# Patient Record
Sex: Female | Born: 1935 | Race: White | Hispanic: No | Smoking: Former smoker
Health system: Southern US, Community
[De-identification: ages and names within clinical notes are randomized; demographics above are authoritative.]

## PROBLEM LIST (undated history)

## (undated) DIAGNOSIS — D649 Anemia, unspecified: Secondary | ICD-10-CM

## (undated) DIAGNOSIS — I82409 Acute embolism and thrombosis of unspecified deep veins of unspecified lower extremity: Secondary | ICD-10-CM

## (undated) DIAGNOSIS — I4891 Unspecified atrial fibrillation: Secondary | ICD-10-CM

## (undated) HISTORY — DX: Acute embolism and thrombosis of unspecified deep veins of unspecified lower extremity: I82.409

## (undated) HISTORY — PX: CHOLECYSTECTOMY: SHX55

## (undated) HISTORY — PX: CATARACT EXTRACTION: SUR2

## (undated) HISTORY — DX: Anemia, unspecified: D64.9

## (undated) HISTORY — DX: Unspecified atrial fibrillation: I48.91

## (undated) HISTORY — PX: JOINT REPLACEMENT: SHX530

## (undated) HISTORY — PX: ABDOMINAL AORTIC ANEURYSM REPAIR: SUR1152

## (undated) HISTORY — PX: FOOT SURGERY: SHX648

---

## 2014-08-13 ENCOUNTER — Encounter: Payer: Self-pay | Admitting: Podiatry

## 2014-08-13 ENCOUNTER — Ambulatory Visit (INDEPENDENT_AMBULATORY_CARE_PROVIDER_SITE_OTHER): Payer: MEDICARE | Admitting: Podiatry

## 2014-08-13 VITALS — BP 134/62 | HR 72

## 2014-08-13 DIAGNOSIS — M2040 Other hammer toe(s) (acquired), unspecified foot: Secondary | ICD-10-CM | POA: Insufficient documentation

## 2014-08-13 DIAGNOSIS — L600 Ingrowing nail: Secondary | ICD-10-CM | POA: Diagnosis not present

## 2014-08-13 DIAGNOSIS — M79606 Pain in leg, unspecified: Secondary | ICD-10-CM

## 2014-08-13 DIAGNOSIS — M79605 Pain in left leg: Secondary | ICD-10-CM

## 2014-08-13 DIAGNOSIS — B351 Tinea unguium: Secondary | ICD-10-CM

## 2014-08-13 NOTE — Progress Notes (Signed)
Subjective: 81 year old female presents complaining of pain in big toes and corns, and requesting toe nails trimmed. She has moved recently from [place], [place], where she used to have regular foot care done every 3 months.   Review of Systems - General ROS: negative for - chills, fever, hot flashes or sleep disturbance Ophthalmic ROS: Has had cataract replaced. Allergy and Immunology ROS: Penicillin, Floracabins, Procaine, Soybean, Beans, IV Dye, Aspirin.  Breast ROS: negative for breast lumps Respiratory ROS: no cough, shortness of breath, or wheezing Cardiovascular ROS: no chest pain or dyspnea on exertion Gastrointestinal ROS: Has Hiatal hernia. Genito-Urinary ROS: no dysuria, trouble voiding, or hematuria Musculoskeletal ROS: Hip right and shoulder pain. Right knee pain and need implant.  Neurological ROS: no TIA or stroke symptoms Dermatological ROS: negative.  Objective: Dermatologic:  Mild digital corn 3rd right, 2nd and 5th left. Ingrown hallucal nail with pain on both great toes. No drainage or signs of infection noted. Thick deformed nails x 10. Vascular: Pedal pulses are not palpable bilateral, both DP and PT. Positive of ankle edema, pitting bilateral. Neurologic: All epicritic and tactile sensations grossly intact. Normal response to Monofilament sensory testing and Vibratory sensor testing. Ankle DTR has normal response. Orthopedic: Contracted digit with digital corn 2nd and 5th left, 3rd right. Mildly enlarged medial eminence of first metatarsal bone bilateral.  Assessment: Painful ingrown nail both great toes without infection. Mycotic nails x 10. Vascular insufficiency bilateral. Painful hammer toe with digital corns 3rd right, 2nd and 5th left.  Plan: Reviewed findings and available treatment options. All nails debrided. Ingrown nail offending borders resected. All painful corns debrided. Left great toe, bleeding lateral border cleansed with Betadine and dry sterile  compression bandage applied. Return in 3 months.

## 2014-08-13 NOTE — Patient Instructions (Signed)
Seen for hypertrophic and painful nails. All nails debrided. Ingrown nail resected. Return in 3 months or as needed.

## 2014-08-15 ENCOUNTER — Telehealth (HOSPITAL_COMMUNITY): Payer: Self-pay | Admitting: *Deleted

## 2014-08-15 NOTE — Telephone Encounter (Signed)
I returned a call to Becky Wells in regards to a voice message I received from him.  His mother was concerned about her dye allergy and wanted assurance that our procedure does not involve the use of dye.  I assured him that this was an ultrasound appointment and no dye would be utilized during her procedure.

## 2014-08-16 ENCOUNTER — Encounter: Payer: Self-pay | Admitting: Surgery

## 2014-08-19 ENCOUNTER — Ambulatory Visit (INDEPENDENT_AMBULATORY_CARE_PROVIDER_SITE_OTHER): Payer: MEDICARE | Admitting: Surgery

## 2014-08-19 ENCOUNTER — Other Ambulatory Visit (HOSPITAL_COMMUNITY): Payer: Self-pay

## 2014-08-19 ENCOUNTER — Encounter: Payer: Self-pay | Admitting: Surgery

## 2014-08-19 VITALS — BP 145/54 | HR 64 | Ht 63.0 in | Wt 150.0 lb

## 2014-08-19 DIAGNOSIS — Z01810 Encounter for preprocedural cardiovascular examination: Secondary | ICD-10-CM | POA: Diagnosis not present

## 2014-08-19 DIAGNOSIS — I714 Abdominal aortic aneurysm, without rupture, unspecified: Secondary | ICD-10-CM

## 2014-08-19 MED ORDER — PREDNISONE 50 MG PO TABS: ORAL_TABLET | ORAL | Status: DC

## 2014-08-19 MED ORDER — DIPHENHYDRAMINE HCL 50 MG PO CAPS: ORAL_CAPSULE | ORAL | Status: DC

## 2014-08-19 NOTE — Progress Notes (Signed)
Patient name: Becky Wells MRN: [id number] DOB: 1919-05-30 Sex: female   Referred by: Dr. Riley NearingAguiar  Reason for referral:  Chief Complaint  Patient presents with  . New Evaluation    eval EVAR repair - needs to be est locally    HISTORY OF PRESENT ILLNESS: This is a 81 year old female who comes in today to establish vascular care.  In October 2015, she underwent endovascular repair of a 7 cm abdominal aortic aneurysm, while in [place].  She states that she has not had any postoperative imaging.  She complains of swelling around her right groin incision site which has been getting bigger.  She denies any fevers or chills.  The patient also complains of lower extremity swelling.  She cannot wear compression stockings, as she cannot get them on.  She is medically managed for hypercholesterolemia with a statin.  She is treated with multiple medications for hypertension.  She carries a diagnosis of atrial fibrillation and history of DVT but is not on anticoagulation.  She has a history of smoking but quit many years ago.  She now lives in a retirement community  Past Medical History  Diagnosis Date  . Anemia   . Atrial fibrillation   . DVT (deep venous thrombosis)     Past Surgical History  Procedure Laterality Date  . Joint replacement Left     knee  . Cataract extraction Bilateral   . Abdominal aortic aneurysm repair    . Cholecystectomy    . Foot surgery      toe    History   Social History  . Marital Status: Widowed    Spouse Name: N/A  . Number of Children: N/A  . Years of Education: N/A   Occupational History  . Not on file.   Social History Main Topics  . Smoking status: Former Smoker    Types: Cigarettes    Quit date: 07/06/1955  . Smokeless tobacco: Never Used  . Alcohol Use: No  . Drug Use: No  . Sexual Activity: Not on file   Other Topics Concern  . Not on file   Social History Narrative    Family History  Problem Relation Age of Onset  . Heart disease  Mother   . Hypertension Mother   . Varicose Veins Mother   . Heart attack Mother   . Hypertension Father   . Heart disease Father   . Heart attack Father   . Diabetes Sister   . Hypertension Sister   . Heart attack Sister   . Hypertension Brother   . Heart attack Brother   . Deep vein thrombosis Son   . Hypertension Son     Allergies as of 08/19/2014 - Review Complete 08/19/2014  Allergen Reaction Noted  . Aspirin  08/13/2014  . Iodinated diagnostic agents  08/13/2014  . Penicillins  08/13/2014  . Procaine  08/13/2014  . Soybean oil  08/13/2014    Current Outpatient Prescriptions on File Prior to Visit  Medication Sig Dispense Refill  . amLODipine (NORVASC) 5 MG tablet Take 5 mg by mouth.    . bisacodyl (DULCOLAX) 10 MG suppository Place 10 mg rectally.    . calcium-vitamin D (OSCAL WITH D) 500-200 MG-UNIT per tablet Take by mouth.    . ferrous sulfate 325 (65 FE) MG tablet Take 325 mg by mouth.    . folic acid (FOLVITE) 1 MG tablet Take 1 mg by mouth.    . guaifenesin (ROBITUSSIN) 100 MG/5ML syrup Take  200 mg by mouth.    Marland Kitchen HYDROcodone-acetaminophen (NORCO/VICODIN) 5-325 MG per tablet Take by mouth.    . losartan (COZAAR) 100 MG tablet Take 100 mg by mouth.    . metoprolol tartrate (LOPRESSOR) 25 MG tablet Take 25 mg by mouth.    . Naproxen Sodium 220 MG CAPS Take by mouth as needed.     . pantoprazole (PROTONIX) 20 MG tablet Take 20 mg by mouth.    . potassium chloride SA (K-DUR,KLOR-CON) 20 MEQ tablet Take by mouth.    . senna (SENOKOT) 8.6 MG tablet Take by mouth as needed.     . sertraline (ZOLOFT) 25 MG tablet Take 25 mg by mouth.    . simvastatin (ZOCOR) 20 MG tablet Take 20 mg by mouth.    . spironolactone (ALDACTONE) 25 MG tablet Take 25 mg by mouth.     No current facility-administered medications on file prior to visit.     REVIEW OF SYSTEMS: Cardiovascular: No chest pain, chest pressure, palpitations, orthopnea, or dyspnea on exertion. No claudication or  rest pain,  positive for leg swelling and varicose veins Pulmonary: No productive cough, asthma or wheezing. Neurologic: No weakness, paresthesias, aphasia, or amaurosis. No dizziness. Hematologic: No bleeding problems or clotting disorders. Musculoskeletal: No joint pain or joint swelling. Gastrointestinal: No blood in stool or hematemesis Genitourinary: No dysuria or hematuria. Psychiatric:: No history of major depression. Integumentary: No rashes or ulcers. Constitutional: No fever or chills.  PHYSICAL EXAMINATION: General: The patient appears their stated age.  Vital signs are BP 145/54 mmHg  Pulse 64  Ht  (1.6 m)  Wt 150 lb (68.04 kg)  BMI 26.58 kg/m2  SpO2 99% HEENT:  No gross abnormalities Pulmonary: Respirations are non-labored Abdomen: Soft and non-tender .  I cannot palpate a pulsatile mass Musculoskeletal: There are no major deformities.   Neurologic: No focal weakness or paresthesias are detected, Skin: There are no ulcer or rashes noted. Psychiatric: The patient has normal affect. Cardiovascular: There is a regular rate and rhythm without significant murmur appreciated.  No carotid bruits.  Bilateral edema  Prominent bulging in the right groin consistent with seroma, as there is no pulsatility.  There is no evidence of infection  Diagnostic Studies: None    Assessment:  History of abdominal aortic aneurysm Plan: Patient is transferring her vascular care to me, as she has recently moved to the area.  She tells me that she has not had any postoperative imaging.  She does have a history of renal insufficiency.  Her most recent creatinine was 1.6, however recently it has been as high as 1.9.  I would like to get a contrasted CT scan so I can evaluate her repair, as well as evaluate the fluid collection in her right groin which I expect to be a seroma but want to make sure there is no pseudoaneurysm present.  The patient also has a contrast allergy.  She has  tolerated premedication in the past and I will do so again this time.  On the patient returns I will get a carotid ultrasound that she tells me she has not had her carotid arteries ever evaluated.     Jorge Ny, M.D. [facility] Office: [phone] Pager:  [phone]

## 2014-08-19 NOTE — Addendum Note (Signed)
Addended by: Sharee PimpleMCCHESNEY, MARILYN K on: 08/19/2014 10:05 AM   Modules accepted: Orders

## 2014-08-27 ENCOUNTER — Other Ambulatory Visit: Payer: Self-pay | Admitting: *Deleted

## 2014-08-27 DIAGNOSIS — Z01810 Encounter for preprocedural cardiovascular examination: Secondary | ICD-10-CM

## 2014-08-27 DIAGNOSIS — I714 Abdominal aortic aneurysm, without rupture, unspecified: Secondary | ICD-10-CM

## 2014-08-27 MED ORDER — DIPHENHYDRAMINE HCL 50 MG PO CAPS: ORAL_CAPSULE | ORAL | Status: DC

## 2014-08-27 MED ORDER — PREDNISONE 50 MG PO TABS: ORAL_TABLET | ORAL | Status: DC

## 2014-08-28 ENCOUNTER — Ambulatory Visit (HOSPITAL_COMMUNITY)
Admission: RE | Admit: 2014-08-28 | Discharge: 2014-08-28 | Disposition: A | Discharge status: Home / Self Care | Payer: Medicare Other | Source: Ambulatory Visit | Attending: Surgery | Admitting: Surgery

## 2014-08-28 ENCOUNTER — Encounter (HOSPITAL_COMMUNITY): Payer: Self-pay

## 2014-08-28 DIAGNOSIS — I714 Abdominal aortic aneurysm, without rupture, unspecified: Secondary | ICD-10-CM

## 2014-08-28 DIAGNOSIS — Z01810 Encounter for preprocedural cardiovascular examination: Secondary | ICD-10-CM | POA: Diagnosis not present

## 2014-08-28 LAB — POCT I-STAT CREATININE: CREATININE: 1.3 mg/dL — AB (ref 0.50–1.10)

## 2014-08-28 MED ORDER — IOHEXOL 350 MG/ML SOLN
80.0000 mL | Freq: Once | INTRAVENOUS | Status: AC | PRN
  Administered 2014-08-28: 80 mL via INTRAVENOUS

## 2014-08-28 NOTE — Progress Notes (Signed)
Premedicated for iodianated allergy

## 2014-09-02 ENCOUNTER — Ambulatory Visit: Payer: Self-pay | Admitting: Surgery

## 2014-09-02 ENCOUNTER — Other Ambulatory Visit (HOSPITAL_COMMUNITY): Payer: Self-pay

## 2014-09-06 ENCOUNTER — Encounter: Payer: Self-pay | Admitting: Surgery

## 2014-09-09 ENCOUNTER — Other Ambulatory Visit: Payer: Self-pay | Admitting: Surgery

## 2014-09-09 ENCOUNTER — Ambulatory Visit (HOSPITAL_COMMUNITY)
Admission: RE | Admit: 2014-09-09 | Discharge: 2014-09-09 | Disposition: A | Discharge status: Home / Self Care | Payer: Medicare Other | Source: Ambulatory Visit | Attending: Surgery | Admitting: Surgery

## 2014-09-09 ENCOUNTER — Encounter: Payer: Self-pay | Admitting: Surgery

## 2014-09-09 ENCOUNTER — Other Ambulatory Visit: Payer: Self-pay

## 2014-09-09 ENCOUNTER — Ambulatory Visit (INDEPENDENT_AMBULATORY_CARE_PROVIDER_SITE_OTHER): Payer: MEDICARE | Admitting: Surgery

## 2014-09-09 VITALS — BP 148/73 | HR 73 | Ht 63.0 in | Wt 150.0 lb

## 2014-09-09 DIAGNOSIS — I714 Abdominal aortic aneurysm, without rupture, unspecified: Secondary | ICD-10-CM

## 2014-09-09 DIAGNOSIS — I6523 Occlusion and stenosis of bilateral carotid arteries: Secondary | ICD-10-CM

## 2014-09-09 DIAGNOSIS — Z01810 Encounter for preprocedural cardiovascular examination: Secondary | ICD-10-CM

## 2014-09-09 MED ORDER — PREDNISONE 50 MG PO TABS: ORAL_TABLET | ORAL | Status: DC

## 2014-09-09 MED ORDER — DIPHENHYDRAMINE HCL 50 MG PO CAPS: ORAL_CAPSULE | ORAL | Status: DC

## 2014-09-09 NOTE — Progress Notes (Signed)
Patient name: Becky Wells MRN: [id number] DOB: 1919-02-10 Sex: female     Chief Complaint  Patient presents with  . Re-evaluation    1-3 wk f/u- CTA prior and carotid duplex    HISTORY OF PRESENT ILLNESS: The patient is back today for follow-up.  She is status post endovascular aneurysm repair in October 2015 for 7 cm aneurysm.  This was done in [place].  She has not had postoperative imaging.  She is transferring her care to me.  She also complains of swelling in her right groin.  I sent her for a CT scan.  She is back today to discuss these results.  Past Medical History  Diagnosis Date  . Anemia   . Atrial fibrillation   . DVT (deep venous thrombosis)     Past Surgical History  Procedure Laterality Date  . Joint replacement Left     knee  . Cataract extraction Bilateral   . Abdominal aortic aneurysm repair    . Cholecystectomy    . Foot surgery      toe    History   Social History  . Marital Status: Widowed    Spouse Name: N/A  . Number of Children: N/A  . Years of Education: N/A   Occupational History  . Not on file.   Social History Main Topics  . Smoking status: Former Smoker    Types: Cigarettes    Quit date: 07/06/1955  . Smokeless tobacco: Never Used  . Alcohol Use: No  . Drug Use: No  . Sexual Activity: Not on file   Other Topics Concern  . Not on file   Social History Narrative    Family History  Problem Relation Age of Onset  . Heart disease Mother   . Hypertension Mother   . Varicose Veins Mother   . Heart attack Mother   . Hypertension Father   . Heart disease Father   . Heart attack Father   . Diabetes Sister   . Hypertension Sister   . Heart attack Sister   . Hypertension Brother   . Heart attack Brother   . Deep vein thrombosis Son   . Hypertension Son     Allergies as of 09/09/2014 - Review Complete 09/09/2014  Allergen Reaction Noted  . Aspirin  08/13/2014  . Iodinated diagnostic agents  08/13/2014  . Penicillins   08/13/2014  . Procaine  08/13/2014  . Soybean oil  08/13/2014    Current Outpatient Prescriptions on File Prior to Visit  Medication Sig Dispense Refill  . amLODipine (NORVASC) 5 MG tablet Take 5 mg by mouth.    . bisacodyl (DULCOLAX) 10 MG suppository Place 10 mg rectally.    . calcium-vitamin D (OSCAL WITH D) 500-200 MG-UNIT per tablet Take by mouth.    . Cyanocobalamin (VITAMIN B12 PO) Take by mouth daily.    . diphenhydrAMINE (BENADRYL) 50 MG capsule Take 50 mg by mouth 1 hour prior to your procedure. 2 capsule 0  . ferrous sulfate 325 (65 FE) MG tablet Take 325 mg by mouth.    . folic acid (FOLVITE) 1 MG tablet Take 1 mg by mouth.    . guaifenesin (ROBITUSSIN) 100 MG/5ML syrup Take 200 mg by mouth.    Marland Kitchen HYDROcodone-acetaminophen (NORCO/VICODIN) 5-325 MG per tablet Take by mouth.    . losartan (COZAAR) 100 MG tablet Take 100 mg by mouth.    . metoprolol tartrate (LOPRESSOR) 25 MG tablet Take 25 mg by mouth.    Marland Kitchen  Naproxen Sodium 220 MG CAPS Take by mouth as needed.     . pantoprazole (PROTONIX) 20 MG tablet Take 20 mg by mouth.    . potassium chloride SA (K-DUR,KLOR-CON) 20 MEQ tablet Take by mouth.    . predniSONE (DELTASONE) 50 MG tablet One tablet (50mg ) 13 hours prior to procedure; one tablet (50mg ) 7 hours prior to procedure and then one tablet (50 mg) one hour prior to procedure. 3 tablet 0  . senna (SENOKOT) 8.6 MG tablet Take by mouth as needed.     . sertraline (ZOLOFT) 25 MG tablet Take 25 mg by mouth.    . simvastatin (ZOCOR) 20 MG tablet Take 20 mg by mouth.    . spironolactone (ALDACTONE) 25 MG tablet Take 25 mg by mouth.     No current facility-administered medications on file prior to visit.     REVIEW OF SYSTEMS: No changes from prior visit  PHYSICAL EXAMINATION:   Vital signs are BP 148/73 mmHg  Pulse 73  Ht 5\' 3"  (1.6 m)  Wt 150 lb (68.04 kg)  BMI 26.58 kg/m2  SpO2 98% General: The patient appears their stated age. HEENT:  No gross  abnormalities Pulmonary:  Non labored breathing Abdomen: Persistent swelling in the right groin without significant change.  No evidence of infection Musculoskeletal: There are no major deformities. Neurologic: No focal weakness or paresthesias are detected, Skin: There are no ulcer or rashes noted. Psychiatric: The patient has normal affect. Cardiovascular: There is a regular rate and rhythm without significant murmur appreciated.   Diagnostic Studies I have reviewed her CT scan.  Her stent graft is in excellent position.  There is a small type II endoleak.  She does have a significant seroma on the right groin.  Carotid Doppler studies were performed today.  No significant stenosis was identified.  Assessment: Abdominal aortic aneurysm Plan: The patient has a small type II endoleak.  I will reevaluate this in 6 months with a repeat CT scan.  She does have a large seroma in the right groin.  I discussed with her son that given her age and the fact that this is not painful, I would prefer to manage this nonoperatively.  She will continue to observe this.  If it increases in size or spontaneously drained she will contact me and we will consider operative treatment.  Otherwise she will follow up in 6 months with her CT scan.  Becky Wells, M.D. [facility] [place] Office: [phone] Pager:  [phone]

## 2014-11-11 ENCOUNTER — Ambulatory Visit: Payer: Medicare Other | Admitting: Podiatry

## 2014-11-12 ENCOUNTER — Ambulatory Visit (INDEPENDENT_AMBULATORY_CARE_PROVIDER_SITE_OTHER): Payer: MEDICARE | Admitting: Podiatry

## 2014-11-12 ENCOUNTER — Encounter: Payer: Self-pay | Admitting: Podiatry

## 2014-11-12 VITALS — BP 151/99 | HR 67

## 2014-11-12 DIAGNOSIS — M79606 Pain in leg, unspecified: Secondary | ICD-10-CM

## 2014-11-12 DIAGNOSIS — B351 Tinea unguium: Secondary | ICD-10-CM

## 2014-11-12 DIAGNOSIS — L600 Ingrowing nail: Secondary | ICD-10-CM

## 2014-11-12 NOTE — Patient Instructions (Signed)
Seen for hypertrophic nails. All nails debrided. Return in 3 months or as needed.  

## 2014-11-12 NOTE — Progress Notes (Signed)
Subjective: 81 year old female presents complaining of pain in big toes and corns, and requesting toe nails trimmed. Left big toe has been hurting.   Objective: Dermatologic: Digital corn 5th right. Painful ingrown nail left great toe without infection.  Thick deformed nails x 10. Vascular: Pedal pulses are not palpable bilateral, both DP and PT. Positive of ankle edema, pitting bilateral. Neurologic: All epicritic and tactile sensations grossly intact. Normal response to Monofilament sensory testing and Vibratory sensor testing. Ankle DTR has normal response. Orthopedic: Contracted digit with digital corn 2nd and 5th left, 3rd right. Mildly enlarged medial eminence of first metatarsal bone bilateral.  Assessment: Painful ingrown nail left hallux without infection. Mycotic nails x 10. Vascular insufficiency bilateral. Painful hammer toe 5th right.  Bunion deformity bilateral.   Plan: Reviewed findings and available treatment options. All nails debrided. Ingrown nail offending borders resected. All painful corns debrided. Return in 3 months

## 2015-02-12 ENCOUNTER — Ambulatory Visit (INDEPENDENT_AMBULATORY_CARE_PROVIDER_SITE_OTHER): Payer: MEDICARE | Admitting: Podiatry

## 2015-02-12 ENCOUNTER — Encounter: Payer: Self-pay | Admitting: Podiatry

## 2015-02-12 VITALS — BP 172/70 | HR 72 | Ht 63.0 in | Wt 160.0 lb

## 2015-02-12 DIAGNOSIS — M79673 Pain in unspecified foot: Secondary | ICD-10-CM

## 2015-02-12 DIAGNOSIS — L600 Ingrowing nail: Secondary | ICD-10-CM

## 2015-02-12 DIAGNOSIS — B351 Tinea unguium: Secondary | ICD-10-CM

## 2015-02-12 DIAGNOSIS — M79606 Pain in leg, unspecified: Secondary | ICD-10-CM

## 2015-02-12 NOTE — Patient Instructions (Signed)
Seen for hypertrophic nails. All nails debrided. Return in 3 months or as needed.  

## 2015-02-12 NOTE — Progress Notes (Signed)
Subjective: 81 year old female presents complaining of pain in big toes and corns, and requesting toe nails trimmed.   Objective: Dermatologic: Digital corn 5th right. Painful ingrown nail left great toe without infection.  Thick deformed nails x 10. Vascular: Pedal pulses are not palpable bilateral, both DP and PT. Positive of ankle edema, pitting bilateral. Neurologic: All epicritic and tactile sensations grossly intact. Normal response to Monofilament sensory testing and Vibratory sensor testing. Ankle DTR has normal response. Orthopedic: Contracted digit with digital corn 2nd and 5th left, 3rd right. Mildly enlarged medial eminence of first metatarsal bone bilateral.  Assessment: Painful ingrown nail left hallux without infection. Mycotic nails x 10. Vascular insufficiency bilateral. Painful hammer toe 5th right.  Bunion deformity bilateral.   Plan: Reviewed findings and available treatment options. All nails debrided. Ingrown nail offending borders resected. All painful corns debrided. Return in 3 months

## 2015-03-03 ENCOUNTER — Other Ambulatory Visit: Payer: Self-pay

## 2015-03-03 DIAGNOSIS — Z91041 Radiographic dye allergy status: Secondary | ICD-10-CM

## 2015-03-03 DIAGNOSIS — I714 Abdominal aortic aneurysm, without rupture, unspecified: Secondary | ICD-10-CM

## 2015-03-03 MED ORDER — DIPHENHYDRAMINE HCL 50 MG PO CAPS: ORAL_CAPSULE | ORAL | Status: AC

## 2015-03-03 MED ORDER — PREDNISONE 50 MG PO TABS: ORAL_TABLET | ORAL | Status: AC

## 2015-03-11 ENCOUNTER — Ambulatory Visit (HOSPITAL_COMMUNITY)
Admission: RE | Admit: 2015-03-11 | Discharge: 2015-03-11 | Disposition: A | Discharge status: Home / Self Care | Payer: MEDICARE | Source: Ambulatory Visit | Attending: Surgery | Admitting: Surgery

## 2015-03-11 DIAGNOSIS — Z09 Encounter for follow-up examination after completed treatment for conditions other than malignant neoplasm: Secondary | ICD-10-CM | POA: Diagnosis not present

## 2015-03-11 DIAGNOSIS — I779 Disorder of arteries and arterioles, unspecified: Secondary | ICD-10-CM | POA: Insufficient documentation

## 2015-03-11 DIAGNOSIS — K4090 Unilateral inguinal hernia, without obstruction or gangrene, not specified as recurrent: Secondary | ICD-10-CM | POA: Insufficient documentation

## 2015-03-11 DIAGNOSIS — R188 Other ascites: Secondary | ICD-10-CM | POA: Diagnosis not present

## 2015-03-11 DIAGNOSIS — I714 Abdominal aortic aneurysm, without rupture, unspecified: Secondary | ICD-10-CM

## 2015-03-11 LAB — CREATININE, SERUM
Creatinine, Ser: 1.42 mg/dL — ABNORMAL HIGH (ref 0.44–1.00)
GFR calc Af Amer: 35 mL/min — ABNORMAL LOW (ref >60)
GFR calc non Af Amer: 31 mL/min — ABNORMAL LOW (ref >60)

## 2015-03-11 LAB — BUN: BUN: 26 mg/dL — ABNORMAL HIGH (ref 6–20)

## 2015-03-11 MED ORDER — IOHEXOL 350 MG/ML SOLN
80.0000 mL | Freq: Once | INTRAVENOUS | Status: AC | PRN
  Administered 2015-03-11: 80 mL via INTRAVENOUS

## 2015-03-17 ENCOUNTER — Ambulatory Visit: Payer: Medicare Other | Admitting: Surgery

## 2015-03-24 ENCOUNTER — Encounter: Payer: Self-pay | Admitting: Surgery

## 2015-03-26 ENCOUNTER — Other Ambulatory Visit: Payer: Self-pay | Admitting: *Deleted

## 2015-03-26 ENCOUNTER — Ambulatory Visit (INDEPENDENT_AMBULATORY_CARE_PROVIDER_SITE_OTHER): Payer: MEDICARE | Admitting: Surgery

## 2015-03-26 ENCOUNTER — Encounter: Payer: Self-pay | Admitting: Surgery

## 2015-03-26 VITALS — BP 158/78 | HR 65 | Temp 98.6°F | Resp 16 | Ht 62.0 in | Wt 159.0 lb

## 2015-03-26 DIAGNOSIS — I714 Abdominal aortic aneurysm, without rupture, unspecified: Secondary | ICD-10-CM

## 2015-03-26 DIAGNOSIS — I6523 Occlusion and stenosis of bilateral carotid arteries: Secondary | ICD-10-CM

## 2015-03-26 NOTE — Progress Notes (Signed)
Patient name: Becky Wells MRN: [id number] DOB: July 06, 1918 Sex: female     Chief Complaint  Patient presents with  . AAA    6 month f/u    HISTORY OF PRESENT ILLNESS: The patient comes back for follow-up.  She is status post endovascular aneurysm repair of a 7 cm aneurysm, performed in [place].  When I first met her she had never had postoperative imaging and therefore I sent her for a CT scan 6 months ago.  This showed a small type II endoleak.  She also had large bilateral groin seromas which I elected to observe.  She is back today with no significant changes.  Past Medical History  Diagnosis Date  . Anemia   . Atrial fibrillation   . DVT (deep venous thrombosis)     Past Surgical History  Procedure Laterality Date  . Joint replacement Left     knee  . Cataract extraction Bilateral   . Abdominal aortic aneurysm repair    . Cholecystectomy    . Foot surgery      toe    Social History   Social History  . Marital Status: Widowed    Spouse Name: N/A  . Number of Children: N/A  . Years of Education: N/A   Occupational History  . Not on file.   Social History Main Topics  . Smoking status: Former Smoker    Types: Cigarettes    Quit date: 07/06/1955  . Smokeless tobacco: Never Used  . Alcohol Use: No  . Drug Use: No  . Sexual Activity: Not on file   Other Topics Concern  . Not on file   Social History Narrative    Family History  Problem Relation Age of Onset  . Heart disease Mother   . Hypertension Mother   . Varicose Veins Mother   . Heart attack Mother   . Hypertension Father   . Heart disease Father   . Heart attack Father   . Diabetes Sister   . Hypertension Sister   . Heart attack Sister   . Hypertension Brother   . Heart attack Brother   . Deep vein thrombosis Son   . Hypertension Son     Allergies as of 03/26/2015 - Review Complete 03/26/2015  Allergen Reaction Noted  . Aspirin  08/13/2014  . Iodinated diagnostic agents  08/13/2014  .  Penicillins  08/13/2014  . Procaine  08/13/2014  . Soybean oil  08/13/2014    Current Outpatient Prescriptions on File Prior to Visit  Medication Sig Dispense Refill  . bisacodyl (DULCOLAX) 10 MG suppository Place 10 mg rectally.    . calcium-vitamin D (OSCAL WITH D) 500-200 MG-UNIT per tablet Take by mouth.    . Cyanocobalamin (VITAMIN B12 PO) Take by mouth daily.    . diphenhydrAMINE (BENADRYL) 50 MG capsule Take 50 mg by mouth 1 hour prior to CTA; @ 9:00 AM 9/6 (this should be taken with last dose of Prednisone) 1 capsule 0  . ferrous sulfate 325 (65 FE) MG tablet Take 325 mg by mouth.    . folic acid (FOLVITE) 1 MG tablet Take 1 mg by mouth.    . guaifenesin (ROBITUSSIN) 100 MG/5ML syrup Take 200 mg by mouth.    Marland Kitchen HYDROcodone-acetaminophen (NORCO/VICODIN) 5-325 MG per tablet Take by mouth.    . losartan (COZAAR) 100 MG tablet Take 100 mg by mouth.    . metoprolol tartrate (LOPRESSOR) 25 MG tablet Take 25 mg by mouth.    Marland Kitchen  pantoprazole (PROTONIX) 20 MG tablet Take 20 mg by mouth.    . potassium chloride SA (K-DUR,KLOR-CON) 20 MEQ tablet Take by mouth.    . predniSONE (DELTASONE) 50 MG tablet Take @ 13hrs prior to CTA; @ 9:00 PM 9/5, @ 7hrs prior to CTA; @ 3:00 AM 9/6, and 1 hr prior to CTA; @ 9:00 AM 9/6 3 tablet 0  . senna (SENOKOT) 8.6 MG tablet Take by mouth as needed.     . sertraline (ZOLOFT) 25 MG tablet Take 25 mg by mouth.    . simvastatin (ZOCOR) 20 MG tablet Take 20 mg by mouth.    . spironolactone (ALDACTONE) 25 MG tablet Take 25 mg by mouth.    Marland Kitchen amLODipine (NORVASC) 5 MG tablet Take 5 mg by mouth.    . Naproxen Sodium 220 MG CAPS Take by mouth as needed.      No current facility-administered medications on file prior to visit.     REVIEW OF SYSTEMS: No changes from prior visit  PHYSICAL EXAMINATION:   Vital signs are  Filed Vitals:   03/26/15 1032 03/26/15 1034  BP: 171/74 158/78  Pulse: 65 65  Temp: 98.6 F (37 C)   Resp: 16   Height: 5' 2"  (1.575 m)     Weight: 159 lb (72.122 kg)   SpO2: 95%    Body mass index is 29.07 kg/(m^2). General: The patient appears their stated age. HEENT:  No gross abnormalities Pulmonary:  Non labored breathing Abdomen: Soft and non-tender Musculoskeletal: There are no major deformities. Neurologic: No focal weakness or paresthesias are detected, Skin: There are no ulcer or rashes noted. Psychiatric: The patient has normal affect. Cardiovascular: Bilateral femoral seromas appear grossly unchanged.  There is no drainage or evidence of infection  Diagnostic Studies I have reviewed her CT scan with the following findings: Endovascular repair of the infrarenal abdominal aortic aneurysm. The aneurysm sac is stable in size with a persistent type 2 endoleak. Endoleak appears to be supplied by a lumbar artery.  Complex right abdominal/inguinal hernia containing bowel. No evidence for bowel obstruction or bowel incarceration.  Slightly decreased size of the fluid collections in the inguinal regions.  Indeterminate structure in the pancreatic body which could be artifactual as described. Recommend attention to the pancreatic body region on follow up imaging.  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  Stable small aneurysm or ectasia involving the distal right renal artery. Assessment: Status post endovascular aneurysm repair in [place]: By CT scan, there persists a type II endoleak.  This is likely from a lumbar artery.  No significant change in the aneurysm was noted.  There is slight decrease in the fluid collections in bilateral inguinal regions.  I discussed that as long as aneurysm remains the same size or is getting smaller, I would not recommend any intervention on her endoleak.  In addition if she can tolerate the graft femoral seromas, I would leave them alone and hopefully resolve over time.  If she develops infection or if they  start to drain she will likely need operative intervention.  She will follow up in 6 months with an abdominal ultrasound to evaluate her aneurysm.  Eldridge Abrahams, M.D. [facility] Office: [phone] Pager:  [phone]

## 2015-03-26 NOTE — Progress Notes (Signed)
Filed Vitals:   03/26/15 1032 03/26/15 1034  BP: 171/74 158/78  Pulse: 65 65  Temp: 98.6 F (37 C)   Resp: 16   Height:  (1.575 m)   Weight: 159 lb (72.122 kg)   SpO2: 95%

## 2015-05-15 ENCOUNTER — Encounter: Payer: Self-pay | Admitting: Podiatry

## 2015-05-15 ENCOUNTER — Ambulatory Visit (INDEPENDENT_AMBULATORY_CARE_PROVIDER_SITE_OTHER): Payer: MEDICARE | Admitting: Podiatry

## 2015-05-15 DIAGNOSIS — M79606 Pain in leg, unspecified: Secondary | ICD-10-CM

## 2015-05-15 DIAGNOSIS — B351 Tinea unguium: Secondary | ICD-10-CM

## 2015-05-15 NOTE — Patient Instructions (Signed)
Seen for hypertrophic nails. All nails debrided. Return in 3 months or as needed.  

## 2015-05-15 NOTE — Progress Notes (Signed)
Subjective: 81 year old female presents complaining of pain in big toes and corns, and requesting toe nails trimmed.   Objective: Dermatologic: Thick deformed nails x 10. Vascular: Pedal pulses are not palpable bilateral, both DP and PT. Positive of ankle edema, pitting bilateral. Neurologic: All epicritic and tactile sensations grossly intact. Normal response to Monofilament sensory testing and Vibratory sensor testing. Ankle DTR has normal response. Orthopedic: Contracted digit with digital corn 2nd and 5th left, 3rd right. Mildly enlarged medial eminence of first metatarsal bone bilateral.  Assessment: Mycotic nails x 10. Vascular insufficiency bilateral. Painful hammer toe 5th right.  Bunion deformity bilateral.   Plan: Reviewed findings and available treatment options. All nails debrided.  All painful corns debrided.

## 2015-05-16 ENCOUNTER — Ambulatory Visit: Payer: MEDICARE | Admitting: Podiatry

## 2015-08-14 ENCOUNTER — Ambulatory Visit: Payer: MEDICARE | Admitting: Podiatry

## 2015-08-21 ENCOUNTER — Ambulatory Visit (INDEPENDENT_AMBULATORY_CARE_PROVIDER_SITE_OTHER): Payer: MEDICARE | Admitting: Podiatry

## 2015-08-21 ENCOUNTER — Encounter: Payer: Self-pay | Admitting: Podiatry

## 2015-08-21 VITALS — BP 190/84 | HR 74

## 2015-08-21 DIAGNOSIS — B351 Tinea unguium: Secondary | ICD-10-CM | POA: Diagnosis not present

## 2015-08-21 DIAGNOSIS — M79606 Pain in leg, unspecified: Secondary | ICD-10-CM

## 2015-08-21 NOTE — Patient Instructions (Signed)
Seen for hypertrophic nails. All nails debrided. Return in 3 months or as needed.  

## 2015-08-21 NOTE — Progress Notes (Signed)
Subjective: 82 year old female presents complaining of pain in big toes and corns, and requesting toe nails trimmed.  Both legs are swollen x 2 weeks.  She is making arrangement to get compression socks.  Objective: Dermatologic: Thick deformed nails x 10. Vascular: Pedal pulses are not palpable bilateral, both DP and PT. Positive of ankle edema, pitting bilateral. Neurologic: All epicritic and tactile sensations grossly intact. Normal response to Monofilament sensory testing and Vibratory sensor testing. Ankle DTR has normal response. Orthopedic: Contracted digit with digital corn 2nd and 5th left, 3rd right. Mildly enlarged medial eminence of first metatarsal bone bilateral.  Assessment: Mycotic nails x 10. Vascular insufficiency bilateral with severe lower limb edema bilateral.  Painful hammer toe 5th right.  Bunion deformity bilateral.   Plan: Reviewed findings and available treatment options. All nails debrided and grinded. All painful corns debrided

## 2015-09-29 ENCOUNTER — Ambulatory Visit: Payer: MEDICARE | Admitting: Family

## 2015-09-29 ENCOUNTER — Other Ambulatory Visit (HOSPITAL_COMMUNITY): Payer: MEDICARE

## 2015-09-30 ENCOUNTER — Encounter: Payer: Self-pay | Admitting: Family

## 2015-10-08 ENCOUNTER — Ambulatory Visit (HOSPITAL_COMMUNITY): Payer: MEDICARE

## 2015-10-08 ENCOUNTER — Ambulatory Visit: Payer: MEDICARE | Admitting: Family

## 2015-11-18 ENCOUNTER — Encounter: Payer: Self-pay | Admitting: Podiatry

## 2015-11-18 ENCOUNTER — Ambulatory Visit (INDEPENDENT_AMBULATORY_CARE_PROVIDER_SITE_OTHER): Payer: MEDICARE | Admitting: Podiatry

## 2015-11-18 VITALS — BP 171/61 | HR 70

## 2015-11-18 DIAGNOSIS — L600 Ingrowing nail: Secondary | ICD-10-CM

## 2015-11-18 DIAGNOSIS — B351 Tinea unguium: Secondary | ICD-10-CM

## 2015-11-18 DIAGNOSIS — M79673 Pain in unspecified foot: Secondary | ICD-10-CM

## 2015-11-18 DIAGNOSIS — M79605 Pain in left leg: Secondary | ICD-10-CM

## 2015-11-18 NOTE — Progress Notes (Signed)
Subjective: 82 year old female presents using a wheeled walker complaining of pain in big toes and corns, and requesting toe nails trimmed.   Objective: Dermatologic: Thick deformed nails x 10. Callus under 2nd MPJ left foot painful. Corn on 5th digit painful bilateral and corn on 3rd digit right. Vascular: Pedal pulses are not palpable bilateral, both DP and PT. Positive of ankle edema, pitting bilateral. Neurologic: All epicritic and tactile sensations grossly intact. Normal response to Monofilament sensory testing and Vibratory sensor testing. Ankle DTR has normal response. Orthopedic: Contracted digit with digital corn 2nd and 5th left, 3rd right. Mildly enlarged medial eminence of first metatarsal bone bilateral.  Assessment: Mycotic nails x 10. Vascular insufficiency bilateral with severe lower limb edema bilateral.  Painful hammer toe with painful corn 5th bilateral.  Bunion deformity bilateral.   Plan: Reviewed findings and available treatment options. All nails debrided and grinded. All painful corns debrided. Return in 3 months or as needed. 

## 2015-11-18 NOTE — Patient Instructions (Signed)
Seen for hypertrophic nails. All nails debrided. Return in 3 months or as needed.  

## 2015-12-24 ENCOUNTER — Encounter: Payer: Self-pay | Admitting: Family

## 2015-12-31 ENCOUNTER — Ambulatory Visit (INDEPENDENT_AMBULATORY_CARE_PROVIDER_SITE_OTHER): Payer: MEDICARE | Admitting: Family

## 2015-12-31 ENCOUNTER — Encounter: Payer: Self-pay | Admitting: Family

## 2015-12-31 ENCOUNTER — Ambulatory Visit (HOSPITAL_COMMUNITY)
Admission: RE | Admit: 2015-12-31 | Discharge: 2015-12-31 | Disposition: A | Discharge status: Home / Self Care | Payer: MEDICARE | Source: Ambulatory Visit | Attending: Family | Admitting: Family

## 2015-12-31 VITALS — BP 143/68 | HR 67 | Temp 97.0°F | Resp 14 | Ht 60.0 in | Wt 158.0 lb

## 2015-12-31 DIAGNOSIS — I4891 Unspecified atrial fibrillation: Secondary | ICD-10-CM | POA: Diagnosis not present

## 2015-12-31 DIAGNOSIS — Z95828 Presence of other vascular implants and grafts: Secondary | ICD-10-CM | POA: Diagnosis not present

## 2015-12-31 DIAGNOSIS — Z48812 Encounter for surgical aftercare following surgery on the circulatory system: Secondary | ICD-10-CM

## 2015-12-31 DIAGNOSIS — I714 Abdominal aortic aneurysm, without rupture, unspecified: Secondary | ICD-10-CM

## 2015-12-31 DIAGNOSIS — Z4889 Encounter for other specified surgical aftercare: Secondary | ICD-10-CM | POA: Diagnosis not present

## 2015-12-31 NOTE — Progress Notes (Signed)
[facility]  CC: Follow up s/p EVAR  History of Present Illness  Becky Wells is a 82 y.o. (Jan 09, 1919) female patient of Dr. Trula Slade who is status post endovascular aneurysm repair of a 7 cm aneurysm, performed in [place], pt states in October 2015. When Dr. Trula Slade first met her she had never had postoperative imaging and therefore he sent her for a CT scan in February of 2016. This showed a small type II endoleak. She also had large bilateral groin seromas which Dr. Trula Slade elected to observe. She is back today with no significant changes. Her friend is with her.  Dr. Trula Slade last saw pt on 03/26/15. At that time by CT scan, there persisted a type II endoleak. This is likely from a lumbar artery. No significant change in the aneurysm was noted. There was slight decrease in the fluid collections in bilateral inguinal regions. Dr. Trula Slade discussed with pt that as long as aneurysm remains the same size or is getting smaller, he would not recommend any intervention of her endoleak. In addition if she can tolerate the graft femoral seromas, Dr. Trula Slade advised to leave them alone and hopefully resolve over time. If she develops infection or if they start to drain she will likely need operative intervention. She was to follow up in 6 months with an abdominal ultrasound to evaluate her aneurysm.  She has arthritis pain in her back in the morning, improves after moving around, denies abdominal pain.   March of 2016 carotid duplex (review of records) indicated less than 40% bilateral ICA stenosis, antegrade bilateral vertebral artery flow, and normal subclavian arteries.   Pt Diabetic: No Pt smoker: former smoker, quit in 1957   Past Medical History  Diagnosis Date  . Anemia   . Atrial fibrillation ([place])   . DVT (deep venous thrombosis) [facility])    Past Surgical History  Procedure Laterality Date  . Joint replacement Left     knee  . Cataract extraction Bilateral    . Abdominal aortic aneurysm repair    . Cholecystectomy    . Foot surgery      toe   Social History Social History  Substance Use Topics  . Smoking status: Former Smoker    Types: Cigarettes    Quit date: 07/06/1955  . Smokeless tobacco: Never Used  . Alcohol Use: No   Family History Family History  Problem Relation Age of Onset  . Heart disease Mother   . Hypertension Mother   . Varicose Veins Mother   . Heart attack Mother   . Hypertension Father   . Heart disease Father   . Heart attack Father   . Diabetes Sister   . Hypertension Sister   . Heart attack Sister   . Hypertension Brother   . Heart attack Brother   . Deep vein thrombosis Son   . Hypertension Son    Current Outpatient Prescriptions on File Prior to Visit  Medication Sig Dispense Refill  . folic acid (FOLVITE) 1 MG tablet Take 1 mg by mouth.    Marland Kitchen HYDROcodone-acetaminophen (NORCO/VICODIN) 5-325 MG per tablet Take by mouth.    . levothyroxine (SYNTHROID, LEVOTHROID) 25 MCG tablet Take by mouth.    . losartan (COZAAR) 100 MG tablet Take 100 mg by mouth.    . metoprolol tartrate (LOPRESSOR) 25 MG tablet Take 25 mg by mouth.    . pantoprazole (PROTONIX) 20 MG tablet Take 20 mg by mouth.    . senna (SENOKOT) 8.6  MG tablet Take by mouth as needed.     . sertraline (ZOLOFT) 25 MG tablet Take 25 mg by mouth.    . spironolactone (ALDACTONE) 25 MG tablet Take 25 mg by mouth.    Marland Kitchen amLODipine (NORVASC) 5 MG tablet Take 5 mg by mouth.    . bisacodyl (DULCOLAX) 10 MG suppository Place 10 mg rectally. Reported on 12/31/2015    . calcium-vitamin D (OSCAL WITH D) 500-200 MG-UNIT per tablet Take by mouth.    . Cyanocobalamin (VITAMIN B12 PO) Take by mouth daily. Reported on 12/31/2015    . diphenhydrAMINE (BENADRYL) 50 MG capsule Take 50 mg by mouth 1 hour prior to CTA; @ 9:00 AM 9/6 (this should be taken with last dose of Prednisone) (Patient not taking: Reported on 12/31/2015) 1 capsule 0  . ferrous sulfate 325 (65 FE) MG  tablet Take 325 mg by mouth.    . guaifenesin (ROBITUSSIN) 100 MG/5ML syrup Take 200 mg by mouth. Reported on 12/31/2015    . Naproxen Sodium 220 MG CAPS Take by mouth as needed.     . potassium chloride SA (K-DUR,KLOR-CON) 20 MEQ tablet Take by mouth.    . predniSONE (DELTASONE) 50 MG tablet Take @ 13hrs prior to CTA; @ 9:00 PM 9/5, @ 7hrs prior to CTA; @ 3:00 AM 9/6, and 1 hr prior to CTA; @ 9:00 AM 9/6 (Patient not taking: Reported on 12/31/2015) 3 tablet 0  . simvastatin (ZOCOR) 20 MG tablet Take 20 mg by mouth.     No current facility-administered medications on file prior to visit.   Allergies  Allergen Reactions  . Aspirin   . Iodinated Diagnostic Agents   . Penicillins   . Procaine   . Soybean Oil      ROS: See HPI for pertinent positives and negatives.  Physical Examination  Filed Vitals:   12/31/15 1026 12/31/15 1034  BP: 164/75 143/68  Pulse: 69 67  Temp: 97 F (36.1 C)   Resp: 14   Height: 5' (1.524 m)   Weight: 158 lb (71.668 kg)   SpO2: 96%    Body mass index is 30.86 kg/(m^2).  General: A&O x 3, WD, obese elderly female  Pulmonary: Sym exp, respirations are non labored, good air movt, CTAB, no rales, rhonchi, or wheezing.  Cardiac: RRR, Nl S1, S2, no murmur appreciated  Vascular: Vessel Right Left  Radial 2+Palpable 2+Palpable  Carotid  without bruit  without bruit  Aorta  palpable on expiration N/A  Femoral 2+Palpable 2+Palpable  Popliteal Not palpable Not palpable  PT Palpable Palpable  DP Palpable Palpable   Gastrointestinal: soft, NTND, -G/R, - HSM, - palpable masses except in both groins where the right seroma is 3 cm x 4 cm, left groin seroma is 2 cm x 1.5 cm, - CVAT B.  Musculoskeletal: M/S 4/5 throughout, extremities without ischemic changes. 2+ pitting and non pitting edema in both ankles and feet. Arthritic changes in both hands.   Neurologic: Pain and light touch intact in extremities, Motor exam as listed above, CN 2-12 grossly intact,  is very hard of hearing.    CTA Abd/Pelvis Duplex (Date: 03/11/15) Endovascular repair of the infrarenal abdominal aortic aneurysm. The aneurysm sac is stable in size with a persistent type 2 endoleak. Endoleak appears to be supplied by a lumbar artery. Complex right abdominal/inguinal hernia containing bowel. No evidence for bowel obstruction or bowel incarceration. Slightly decreased size of the fluid collections in the inguinal regions. Indeterminate structure in the pancreatic  body which could be artifactual as described. Recommend attention to the pancreatic body region on follow up imaging. 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 Stable small aneurysm or ectasia involving the distal right renal Artery  Non-Invasive Vascular Imaging  EVAR Duplex (Date: 12/31/2015)  AAA sac size: 7.0 cm x 6.7 cm  03/11/15: 6.2 cm x 7.2 cm  Flow in the aneurysmal sac c/w a known Type ll endoleak.   Medical Decision Making  Ereka Brau is a 82 y.o. female who presents s/p EVAR (Date: October 2015, in [place]).  Pt is asymptomatic with stable sac size. I discussed with Dr., Scot Dock pt's HPI, physical exam results, and he reviewed pt EVAR duplex from today.  I discussed with the patient the importance of surveillance of the endograft.  The next endograft duplex will be scheduled for 6 months.  The patient will follow up with [place] in 6 months with these studies.  I emphasized the importance of maximal medical management including strict control of blood pressure, blood glucose, and lipid levels, antiplatelet agents, obtaining regular exercise, and cessation of smoking.   Thank you for allowing [place] to participate in this patient's care.  Clemon Chambers, RN, MSN, FNP-C [facility] Office: [phone]  Clinic Physician: Scot Dock  12/31/2015, 10:37 AM

## 2015-12-31 NOTE — Progress Notes (Signed)
Filed Vitals:   12/31/15 1026 12/31/15 1034  BP: 164/75 143/68  Pulse: 69 67  Temp: 97 F (36.1 C)   Resp: 14   Height: 5' (1.524 m)   Weight: 158 lb (71.668 kg)   SpO2: 96%

## 2015-12-31 NOTE — Patient Instructions (Signed)
Before your next abdominal ultrasound:  Take two Extra-Strength Gas-X capsules at bedtime the night before the test. Take another two Extra-Strength Gas-X capsules 3 hours before the test.   

## 2016-02-18 ENCOUNTER — Encounter: Payer: Self-pay | Admitting: Podiatry

## 2016-02-18 ENCOUNTER — Ambulatory Visit (INDEPENDENT_AMBULATORY_CARE_PROVIDER_SITE_OTHER): Payer: MEDICARE | Admitting: Podiatry

## 2016-02-18 VITALS — BP 145/55 | HR 69

## 2016-02-18 DIAGNOSIS — L600 Ingrowing nail: Secondary | ICD-10-CM

## 2016-02-18 DIAGNOSIS — M79606 Pain in leg, unspecified: Secondary | ICD-10-CM

## 2016-02-18 DIAGNOSIS — B351 Tinea unguium: Secondary | ICD-10-CM

## 2016-02-18 NOTE — Patient Instructions (Signed)
Seen for hypertrophic nails. All nails debrided. Return in 3 months or as needed.  

## 2016-02-18 NOTE — Progress Notes (Signed)
Subjective: 82 year old female presents using a wheeled walker complaining of pain in big toes and corns, and requesting toe nails trimmed.   Objective: Dermatologic: Thick deformed nails x 10. Callus under 2nd MPJ left foot painful. Corn on 5th digit painful bilateral and corn on 3rd digit right. Vascular: Pedal pulses are not palpable bilateral, both DP and PT. Positive of ankle edema, pitting bilateral. Neurologic: All epicritic and tactile sensations grossly intact. Normal response to Monofilament sensory testing and Vibratory sensor testing. Ankle DTR has normal response. Orthopedic: Contracted digit with digital corn 2nd and 5th left, 3rd right. Mildly enlarged medial eminence of first metatarsal bone bilateral.  Assessment: Mycotic nails x 10. Vascular insufficiency bilateral with severe lower limb edema bilateral.  Painful hammer toe with painful corn 5th bilateral.  Bunion deformity bilateral.   Plan: Reviewed findings and available treatment options. All nails debrided and grinded. All painful corns debrided. Return in 3 months or as needed.

## 2016-05-20 ENCOUNTER — Ambulatory Visit: Payer: MEDICARE | Admitting: Podiatry

## 2016-07-07 ENCOUNTER — Ambulatory Visit: Payer: MEDICARE | Admitting: Family

## 2016-07-07 ENCOUNTER — Other Ambulatory Visit (HOSPITAL_COMMUNITY): Payer: MEDICARE

## 2016-08-05 DEATH — deceased

## 2016-12-20 IMAGING — CT CT CTA ABD/PEL W/CM AND/OR W/O CM
2 of 9 series · 14 of 46 positions shown, 16 images · IV contrast (OMNI)
Comparison: 08/28/2014

CLINICAL DATA: Follow-up abdominal aortic aneurysm repair.
Endovascular repair in April 2014.

EXAM:
CT ANGIOGRAPHY ABDOMEN AND PELVIS
TECHNIQUE: Multidetector CT imaging of the abdomen and pelvis was performed
using the standard protocol during bolus administration of
intravenous contrast. Multiplanar reconstructed images including
MIPs were obtained and reviewed to evaluate the vascular anatomy.
CONTRAST:  80 mL Omnipaque 350

[Series 5: angio 3.0 i30f 3 · axial · 0.81mm/px · z∈[+769,+1129]mm · 11 of 138 slices shown, 13 images]
[im 9/138  soft-tissue]
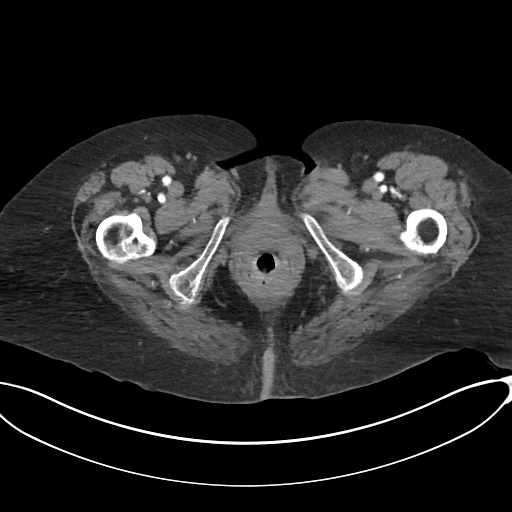
[im 9/138  bone]
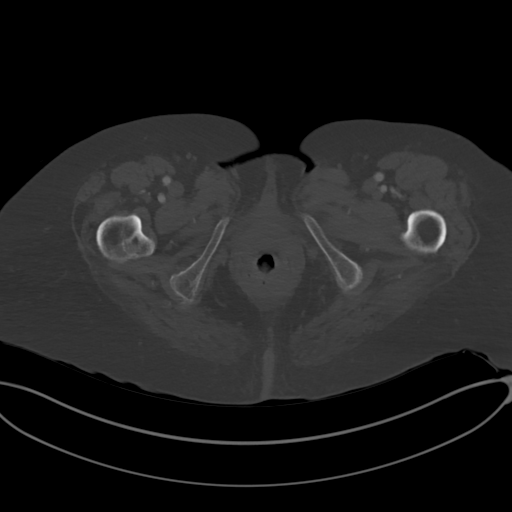
[im 25/138  soft-tissue]
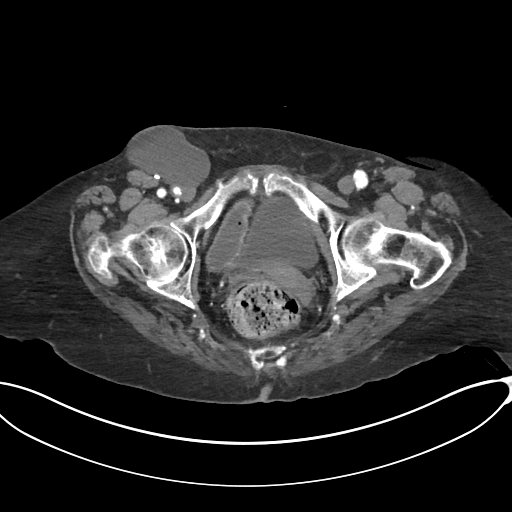
[im 33/138  soft-tissue]
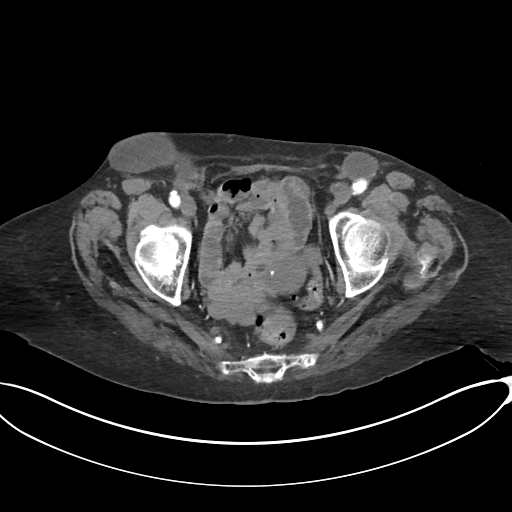
[im 49/138  soft-tissue]
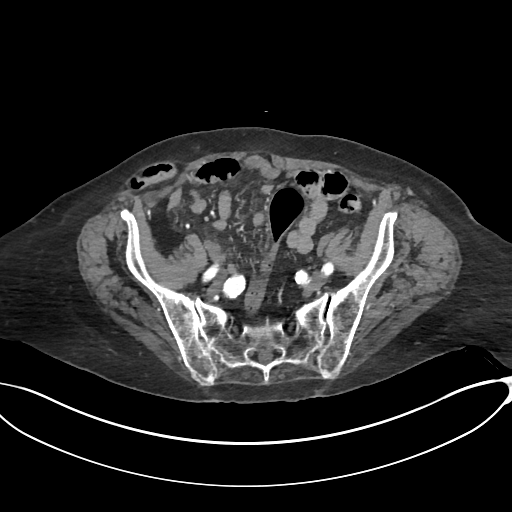
[im 57/138  soft-tissue]
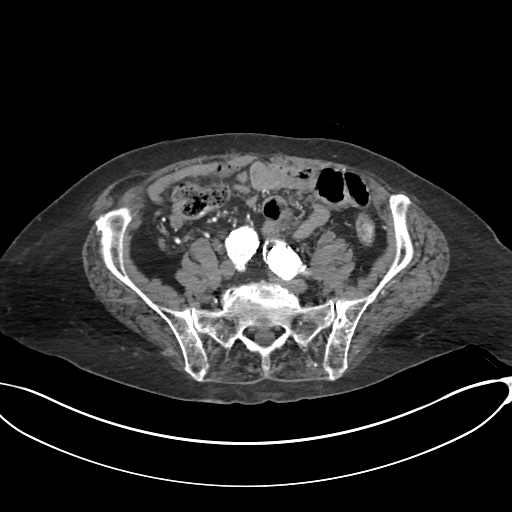
[im 73/138  soft-tissue]
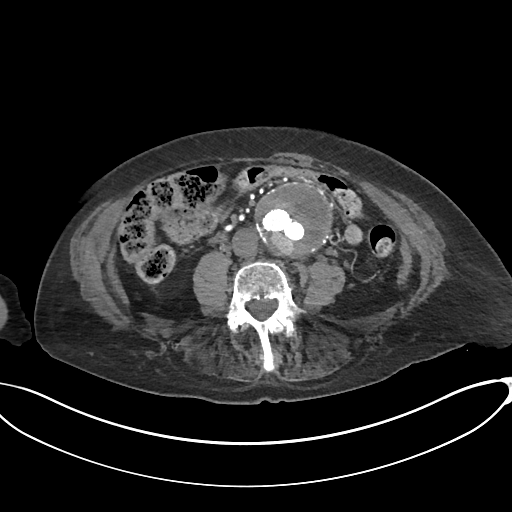
[im 81/138  soft-tissue]
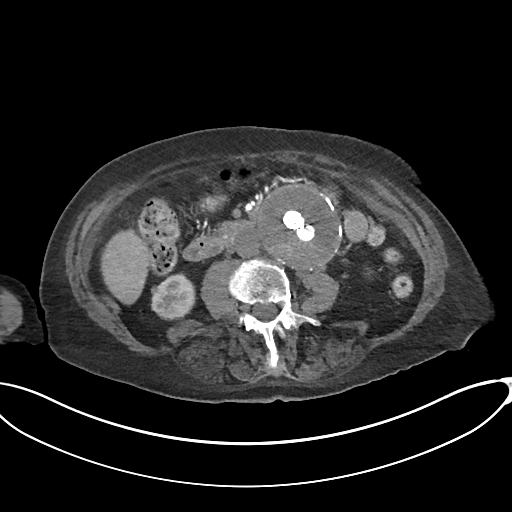
[im 89/138  soft-tissue]
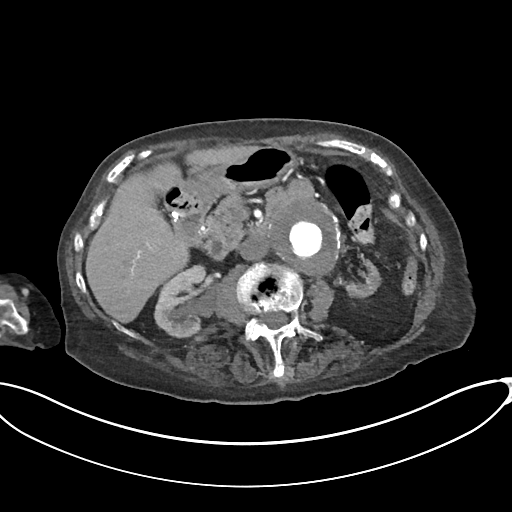
[im 105/138  soft-tissue]
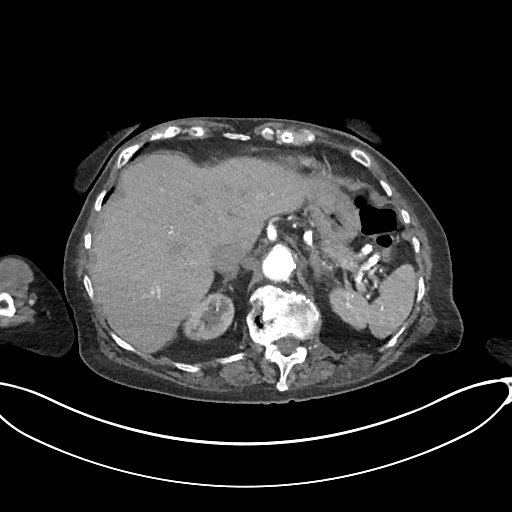
[im 105/138  bone]
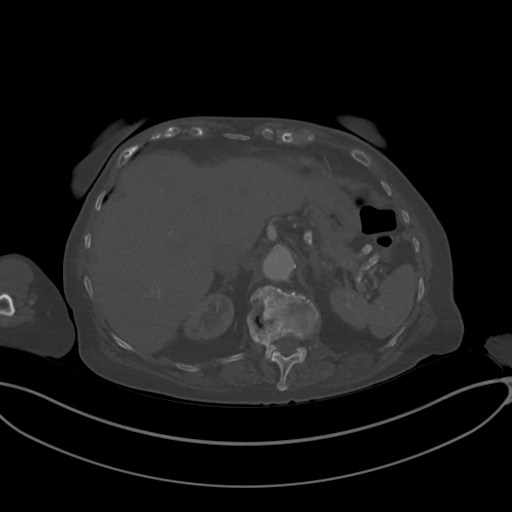
[im 113/138  soft-tissue]
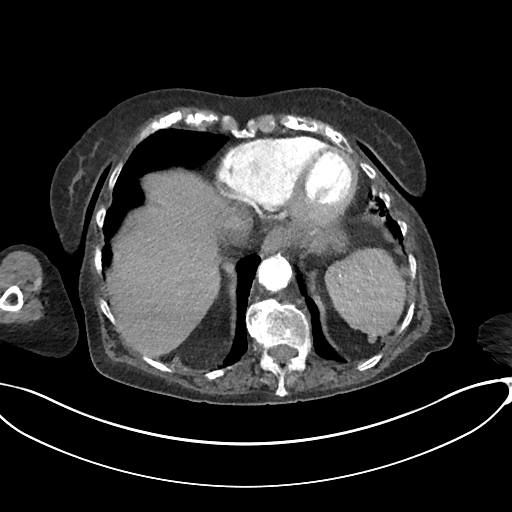
[im 129/138  soft-tissue]
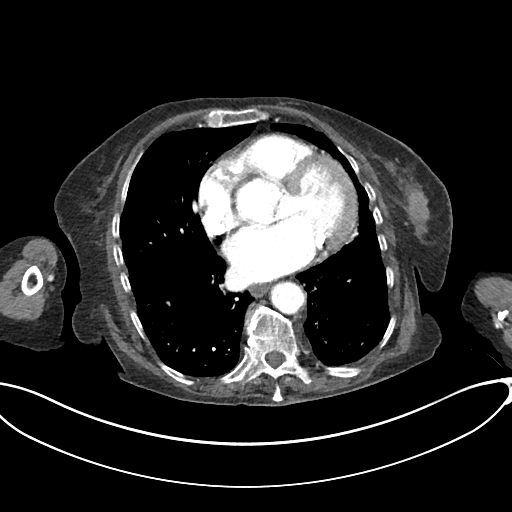

[Series 8: coronals · coronal · 0.86mm/px · 3 of 122 slices shown]
[im 31/122  soft-tissue]
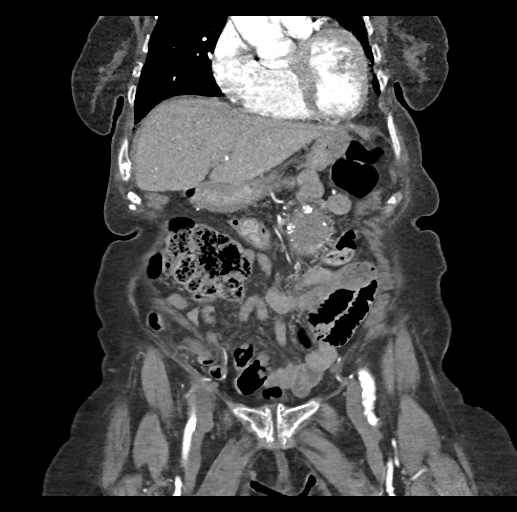
[im 61/122  soft-tissue]
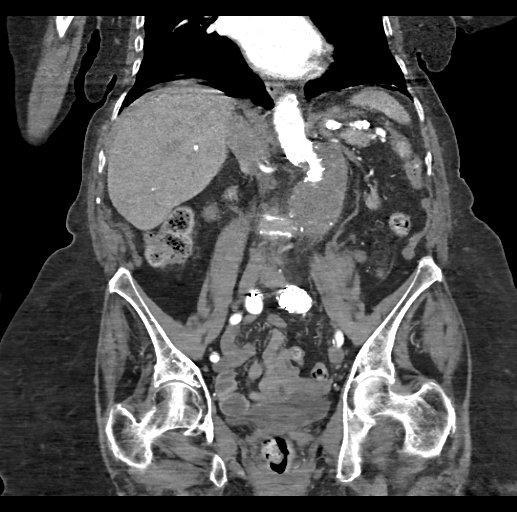
[im 91/122  soft-tissue]
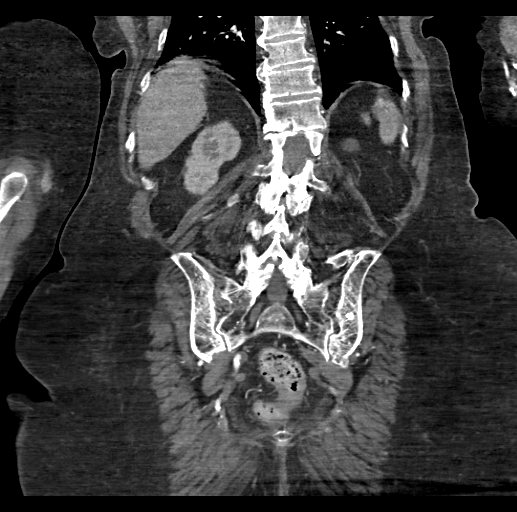

[14 of 46 positions shown; findings below may reference images not displayed]

FINDINGS: ARTERIAL FINDINGS:

Aorta: Again noted is a fusiform infrarenal abdominal aortic
aneurysm and status post endovascular repair with a bifurcated
aortic stent graft. The proximal landing zone is near the origin of
the renal arteries. The position and configuration of the stent
graft is unchanged. The excluded aneurysm sac measures 6.2 x 7.2 cm
on sequence 5, image 57 and previously measured 6.1 x 7.2 cm at a
similar level. The aneurysm sac measures 6.6 cm on the coronal
reformats sequence 8, image 52 and unchanged. Aneurysm sac measures
6.5 cm on the sagittal images, sequence 9, image 106 and stable.
There is high-density material along the posterior aspect of the
graft in the inferior aspect of the aneurysm sac. This is a
compatible with an endoleak. Endoleak appears to be associated with
a lumbar artery. Endoleak is more conspicuous on this examination.

Celiac axis:         Celiac trunk and main branches are patent.

Superior mesenteric: Superior mesenteric artery is patent.

Left renal: The graft is near the origin of the left renal artery.
Left renal artery is small and there is poor visualization of the
left renal artery origin. Suspect left renal artery stenosis near
the origin which is likely chronic based on the atrophic left
kidney.

Right renal: Proximal graft is near the origin of the right renal
artery. Difficult to exclude stenosis at the origin. Again noted is
focal ectasia at the right renal hilum measuring roughly 0.8 cm and
unchanged.

Inferior mesenteric: Origin of the IMA is occluded. There is distal
reconstitution of the IMA.

Left iliac: Graft limb extends into the left common iliac artery.
Distal left common iliac artery measures 2.2 cm and stable. The left
iliac arteries are patent. The left internal iliac artery measures
up to 1.8 cm and minimally changed. Proximal femoral arteries are
patent.

Right iliac: Stent graft extends into the distal right common iliac
artery. Distal right common iliac artery roughly measures 2.4 cm and
previously measured 2.3 cm. The right internal and external iliac
arteries are patent. Proximal right femoral arteries are patent.

Venous findings: Delayed images were not obtained and there is no
significant venous contrast on this examination.

Review of the MIP images confirms the above findings.

NONVASCULAR FINDINGS:

Lung bases are clear. No acute abnormality to the liver. The
gallbladder appears to be surgically absent. No gross abnormality to
the visualized spleen. Questionable 1.7 cm low-density area along
the pancreatic body on sequence 5 image 41. This area could
represent volume averaging. Stable fullness in the adrenal glands.
Again noted is an atrophic left kidney with exophytic cysts. There
is a 1 cm exophytic structure along the right kidney lower pole
which is slightly dense but shows no enhancement on the post
contrast images. This likely represents a complex or small
hemorrhagic cyst. No significant intra-abdominal lymphadenopathy or
free fluid. Uterus is present. Fullness of the right ureter without
evidence for an obstructing stone.

Evidence for bilateral inguinal-type hernias. The right hernia
extends cephalad and contains small bowel. There is no evidence to
suggest bowel inflammation or obstruction. Left inguinal hernia
contains fat. Fluid collection anterior to the right femoral vessels
measures 7.4 x 5.0 cm and previously measured 8.1 x 5.0 cm. Small
low-density collection anterior to the left common femoral artery
measures 2.7 x 3.1 cm and previously measured 2.7 x 3.3 cm.
Multilevel degenerative changes with scoliosis. No acute bone
abnormality.
IMPRESSION: Endovascular repair of the infrarenal abdominal aortic aneurysm. The
aneurysm sac is stable in size with a persistent type 2 endoleak.
Endoleak appears to be supplied by a lumbar artery.

Complex right abdominal/inguinal hernia containing bowel. No
evidence for bowel obstruction or bowel incarceration.

Slightly decreased size of the fluid collections in the inguinal
regions.

Indeterminate structure in the pancreatic body which could be
artifactual as described. Recommend attention to the pancreatic body
region on follow up imaging.

Stenosis of the proximal left renal artery is likely chronic and
related to the atrophic left kidney.

Stable small aneurysm or ectasia involving the distal right renal
artery.
